# Patient Record
Sex: Male | Born: 1975 | Race: White | Hispanic: No | Marital: Married | Smoking: Never smoker
Health system: Southern US, Community
[De-identification: ages and names within clinical notes are randomized; demographics above are authoritative.]

## PROBLEM LIST (undated history)

## (undated) DIAGNOSIS — M958 Other specified acquired deformities of musculoskeletal system: Secondary | ICD-10-CM

## (undated) DIAGNOSIS — J45909 Unspecified asthma, uncomplicated: Secondary | ICD-10-CM

## (undated) HISTORY — PX: EYE SURGERY: SHX253

## (undated) HISTORY — PX: HERNIA REPAIR: SHX51

## (undated) HISTORY — PX: WISDOM TOOTH EXTRACTION: SHX21

---

## 2007-12-11 ENCOUNTER — Emergency Department (HOSPITAL_COMMUNITY): Admission: EM | Admit: 2007-12-11 | Discharge: 2007-12-11 | Payer: Self-pay | Admitting: Emergency Medicine

## 2011-09-21 ENCOUNTER — Ambulatory Visit (INDEPENDENT_AMBULATORY_CARE_PROVIDER_SITE_OTHER): Payer: 59 | Admitting: Family Medicine

## 2011-09-21 VITALS — BP 112/73 | HR 66 | Temp 98.7°F | Resp 20 | Ht 70.5 in | Wt 203.0 lb

## 2011-09-21 DIAGNOSIS — L2089 Other atopic dermatitis: Secondary | ICD-10-CM

## 2011-09-21 DIAGNOSIS — L255 Unspecified contact dermatitis due to plants, except food: Secondary | ICD-10-CM

## 2011-09-21 MED ORDER — PREDNISONE 20 MG PO TABS: 20.0000 mg | ORAL_TABLET | Freq: Every day | ORAL | Status: AC

## 2011-09-21 NOTE — Progress Notes (Signed)
  Subjective:    Patient ID: Bradley Russo, male    DOB: Feb 05, 1976, 36 y.o.   MRN: [id number]  HPI  Patient presents after developing poison ivy last Wednesday. Pruritic lesions on (R) arm and (R)hip. Burns, itches and weeping.  Review of Systems     Objective:   Physical Exam  Constitutional: He appears well-developed.  Cardiovascular: Normal rate, regular rhythm and normal heart sounds.   Pulmonary/Chest: Effort normal and breath sounds normal.  Skin:       Papules and vesicles (R) arm/hip        Assessment & Plan:  Rhus dermatitis  Prednisone 20 mg daily for 10 days OTC topical products recommended Reassurance

## 2016-01-20 ENCOUNTER — Emergency Department (HOSPITAL_COMMUNITY)
Admission: EM | Admit: 2016-01-20 | Discharge: 2016-01-20 | Disposition: A | Discharge status: Home / Self Care | Payer: Worker's Compensation | Attending: Emergency Medicine | Admitting: Emergency Medicine

## 2016-01-20 ENCOUNTER — Emergency Department (HOSPITAL_COMMUNITY): Payer: Worker's Compensation

## 2016-01-20 ENCOUNTER — Encounter (HOSPITAL_COMMUNITY): Payer: Self-pay

## 2016-01-20 DIAGNOSIS — S93401A Sprain of unspecified ligament of right ankle, initial encounter: Secondary | ICD-10-CM | POA: Insufficient documentation

## 2016-01-20 DIAGNOSIS — J45909 Unspecified asthma, uncomplicated: Secondary | ICD-10-CM | POA: Insufficient documentation

## 2016-01-20 DIAGNOSIS — Y929 Unspecified place or not applicable: Secondary | ICD-10-CM | POA: Diagnosis not present

## 2016-01-20 DIAGNOSIS — X509XXA Other and unspecified overexertion or strenuous movements or postures, initial encounter: Secondary | ICD-10-CM | POA: Insufficient documentation

## 2016-01-20 DIAGNOSIS — Y9301 Activity, walking, marching and hiking: Secondary | ICD-10-CM | POA: Diagnosis not present

## 2016-01-20 DIAGNOSIS — Y999 Unspecified external cause status: Secondary | ICD-10-CM | POA: Diagnosis not present

## 2016-01-20 DIAGNOSIS — S99911A Unspecified injury of right ankle, initial encounter: Secondary | ICD-10-CM | POA: Diagnosis present

## 2016-01-20 HISTORY — DX: Unspecified asthma, uncomplicated: J45.909

## 2016-01-20 MED ORDER — IBUPROFEN 400 MG PO TABS
800.0000 mg | ORAL_TABLET | Freq: Once | ORAL | Status: AC
  Administered 2016-01-20: 800 mg via ORAL
  Filled 2016-01-20: qty 2

## 2016-01-20 NOTE — ED Provider Notes (Signed)
Bradley Russo Provider Note   CSN: [id number] Arrival date & time: 01/20/16  1036  By signing my name below, I, [name], attest that this documentation has been prepared under the direction and in the presence of [facility], PA-C. Electronically signed by: [name], ED Scribe. 01/20/16. 11:00 AM.  History   Chief Complaint Chief Complaint  Patient presents with  . Ankle Pain   The history is provided by the patient and medical records. No language interpreter was used.   HPI Comments:  Bradley Russo is a 40 y.o. male who presents to the Emergency Department complaining of right ankle pain status post fall, which occurred an hour ago. Pt reports that he rolled his ankle in a hole when turning backwards and walking and states that he heard a "popping" sound. Pain localized to front and lateral ankle and is characterized as a throbbing sensation. He rates pain a 4/10. Associated symptoms include swelling in ankle, decreased ROM secondary to swelling and pain, and initial numbness, which has improved with elevation. Pt has not taken pain medication prior to arrival. Denies numbness now, tingling or pain in other areas. He endorses rolling his ankle this ankle a few times in the past. No other hx of injuries to RLE.    Past Medical History:  Diagnosis Date  . Asthma     There are no active problems to display for this patient.   Past Surgical History:  Procedure Laterality Date  . HERNIA REPAIR         Home Medications    Prior to Admission medications   Medication Sig Start Date End Date Taking? Authorizing Provider  Fluticasone-Salmeterol (ADVAIR) 100-50 MCG/DOSE AEPB Inhale 1 puff into the lungs every 12 (twelve) hours.    Historical Provider, MD  loratadine (CLARITIN) 10 MG tablet Take 10 mg by mouth daily.    Historical Provider, MD    Family History History reviewed. No pertinent family history.  Social History Social History  Substance Use Topics  .  Smoking status: Never Smoker  . Smokeless tobacco: Former Systems developer  . Alcohol use Yes     Comment: occassional     Allergies   Penicillins   Review of Systems Review of Systems  Musculoskeletal: Positive for arthralgias, joint swelling and myalgias.  Neurological: Negative for weakness and numbness.     Physical Exam Updated Vital Signs BP 122/76 (BP Location: Left Arm)   Pulse (!) 56   Temp 98.5 F (36.9 C) (Oral)   Resp 18   Ht 5' 11"  (1.803 m)   Wt 88.5 kg   SpO2 100%   BMI 27.20 kg/m   Physical Exam  Constitutional: He is oriented to person, place, and time. He appears well-developed and well-nourished. No distress.  HENT:  Head: Normocephalic and atraumatic.  Cardiovascular: Normal rate, regular rhythm, normal heart sounds and intact distal pulses.   Pulmonary/Chest: Effort normal and breath sounds normal. No respiratory distress.  Musculoskeletal:  Right ankle with decreased ROM 2/2 pain. + Swelling and tenderness around the lateral malleolus. No erythema, ecchymosis, or deformity appreciated.No TTP or swelling of fore foot or calf. No break in skin. No warmth. Achilles intact. Good pedal pulse and cap refill of all toes. Wiggling toes without difficulty. Sensation grossly intact.  Neurological: He is alert and oriented to person, place, and time.  Skin: Skin is warm and dry. Capillary refill takes less than 2 seconds. No erythema.  Nursing note and vitals reviewed.  ED Treatments / Results  DIAGNOSTIC STUDIES:  Oxygen Saturation is 98% on room air, normal by my interpretation.    COORDINATION OF CARE:  10:58 AM Discussed treatment plan with pt at bedside, which includes x-ray of right ankle and Ibuprofen, and pt agreed to plan.  Labs (all labs ordered are listed, but only abnormal results are displayed) Labs Reviewed - No data to display  EKG  EKG Interpretation None       Radiology Dg Ankle Complete Right  Result Date: 01/20/2016 CLINICAL  DATA:  Lateral ankle pain and swelling following twisting injury. EXAM: RIGHT ANKLE - COMPLETE 3+ VIEW COMPARISON:  None. FINDINGS: The mineralization and alignment are normal. There is no evidence of acute fracture or dislocation. The joint spaces are maintained. There is moderate lateral soft tissue swelling. There is a plantar calcaneal spur. IMPRESSION: Moderate lateral soft tissue swelling. No acute osseous findings demonstrated. Electronically Signed   By: Richardean Sale M.D.   On: 01/20/2016 11:16    Procedures Procedures (including critical care time)  Medications Ordered in ED Medications  ibuprofen (ADVIL,MOTRIN) tablet 800 mg (800 mg Oral Given 01/20/16 1127)     Initial Impression / Assessment and Plan / ED Course  I have reviewed the triage vital signs and the nursing notes.  Pertinent labs & imaging results that were available during my care of the patient were reviewed by me and considered in my medical decision making (see chart for details).  Clinical Course   ALFERD Russo is a 40 y.o. male who presents to ED for right ankle pain after rolling ankle this morning. On exam, RLE is NVI. He does exhibit significant swelling and tenderness to palpation over lateral malleolus. X-ray was obtained which show moderate soft tissue swelling but no bony abnormalities. ASO brace provided in ED. Crutches offered, but patient declines. RICE and symptomatic home care instructions discussed. PCP or return follow-up if symptoms do not improve. Reasons to return to ED discussed and all questions answered.  Final Clinical Impressions(s) / ED Diagnoses   Final diagnoses:  Ankle sprain, right, initial encounter    New Prescriptions Discharge Medication List as of 01/20/2016 11:34 AM     I personally performed the services described in this documentation, which was scribed in my presence. The recorded information has been reviewed and is accurate.    [facility] Kemal Amores, PA-C 01/20/16  1612    Nat Christen, MD 01/21/16 [phone]

## 2016-01-20 NOTE — ED Triage Notes (Signed)
Pt. Was backing up and rolled his ankle in a hole.  Rt. Lateral ankle is swollen. +pedal pulse. +CNS  Ice pack applied with elevation in Fast Track

## 2016-01-20 NOTE — Discharge Instructions (Signed)
Ibuprofen three times daily as needed for pain. Ice and elevate for additional pain relief. Use ankle brace whenever ambulating. Follow-up with your primary care provider if no improvement in symptoms after 1 week.   COLD THERAPY DIRECTIONS:  Ice or gel packs can be used to reduce both pain and swelling. Ice is the most helpful within the first 24 to 48 hours after an injury or flareup from overusing a muscle or joint.  Ice is effective, has very few side effects, and is safe for most people to use.   If you expose your skin to cold temperatures for too long or without the proper protection, you can damage your skin or nerves. Watch for signs of skin damage due to cold.   HOME CARE INSTRUCTIONS  Follow these tips to use ice and cold packs safely.  Place a dry or damp towel between the ice and skin. A damp towel will cool the skin more quickly, so you may need to shorten the time that the ice is used.  For a more rapid response, add gentle compression to the ice.  Ice for no more than 10 to 20 minutes at a time. The bonier the area you are icing, the less time it will take to get the benefits of ice.  Check your skin after 5 minutes to make sure there are no signs of a poor response to cold or skin damage.  Rest 20 minutes or more in between uses.  Once your skin is numb, you can end your treatment. You can test numbness by very lightly touching your skin. The touch should be so light that you do not see the skin dimple from the pressure of your fingertip. When using ice, most people will feel these normal sensations in this order: cold, burning, aching, and numbness.

## 2016-11-27 ENCOUNTER — Other Ambulatory Visit: Payer: Self-pay | Admitting: Orthopedic Surgery

## 2016-12-07 ENCOUNTER — Encounter (HOSPITAL_BASED_OUTPATIENT_CLINIC_OR_DEPARTMENT_OTHER): Payer: Self-pay | Admitting: *Deleted

## 2016-12-10 ENCOUNTER — Encounter (HOSPITAL_BASED_OUTPATIENT_CLINIC_OR_DEPARTMENT_OTHER)
Admission: RE | Disposition: A | Discharge status: Home / Self Care | Payer: Self-pay | Source: Ambulatory Visit | Attending: Orthopedic Surgery

## 2016-12-10 ENCOUNTER — Ambulatory Visit (HOSPITAL_BASED_OUTPATIENT_CLINIC_OR_DEPARTMENT_OTHER): Payer: Worker's Compensation | Admitting: Anesthesiology

## 2016-12-10 ENCOUNTER — Ambulatory Visit (HOSPITAL_BASED_OUTPATIENT_CLINIC_OR_DEPARTMENT_OTHER)
Admission: RE | Admit: 2016-12-10 | Discharge: 2016-12-10 | Disposition: A | Discharge status: Home / Self Care | Payer: Worker's Compensation | Source: Ambulatory Visit | Attending: Orthopedic Surgery | Admitting: Orthopedic Surgery

## 2016-12-10 ENCOUNTER — Encounter (HOSPITAL_BASED_OUTPATIENT_CLINIC_OR_DEPARTMENT_OTHER): Payer: Self-pay

## 2016-12-10 DIAGNOSIS — Z87828 Personal history of other (healed) physical injury and trauma: Secondary | ICD-10-CM | POA: Insufficient documentation

## 2016-12-10 DIAGNOSIS — M898X7 Other specified disorders of bone, ankle and foot: Secondary | ICD-10-CM | POA: Insufficient documentation

## 2016-12-10 DIAGNOSIS — Z880 Allergy status to penicillin: Secondary | ICD-10-CM | POA: Insufficient documentation

## 2016-12-10 DIAGNOSIS — J45909 Unspecified asthma, uncomplicated: Secondary | ICD-10-CM | POA: Diagnosis not present

## 2016-12-10 DIAGNOSIS — M65871 Other synovitis and tenosynovitis, right ankle and foot: Secondary | ICD-10-CM | POA: Diagnosis not present

## 2016-12-10 DIAGNOSIS — Z87891 Personal history of nicotine dependence: Secondary | ICD-10-CM | POA: Diagnosis not present

## 2016-12-10 HISTORY — PX: ANKLE ARTHROSCOPY: SHX545

## 2016-12-10 HISTORY — DX: Other specified acquired deformities of musculoskeletal system: M95.8

## 2016-12-10 SURGERY — ARTHROSCOPY, ANKLE
Anesthesia: General | Site: Ankle | Laterality: Right

## 2016-12-10 MED ORDER — DEXAMETHASONE SODIUM PHOSPHATE 4 MG/ML IJ SOLN
INTRAMUSCULAR | Status: DC | PRN
  Administered 2016-12-10: 10 mg via INTRAVENOUS

## 2016-12-10 MED ORDER — CHLORHEXIDINE GLUCONATE 4 % EX LIQD: 60.0000 mL | Freq: Once | CUTANEOUS | Status: DC

## 2016-12-10 MED ORDER — LIDOCAINE HCL (CARDIAC) 20 MG/ML IV SOLN
INTRAVENOUS | Status: DC | PRN
  Administered 2016-12-10: 40 mg via INTRAVENOUS

## 2016-12-10 MED ORDER — LACTATED RINGERS IV SOLN
INTRAVENOUS | Status: DC
  Administered 2016-12-10: 12:00:00 via INTRAVENOUS

## 2016-12-10 MED ORDER — PROMETHAZINE HCL 25 MG/ML IJ SOLN: 6.2500 mg | INTRAMUSCULAR | Status: DC | PRN

## 2016-12-10 MED ORDER — HYDROMORPHONE HCL 1 MG/ML IJ SOLN: 0.2500 mg | INTRAMUSCULAR | Status: DC | PRN

## 2016-12-10 MED ORDER — CEFAZOLIN SODIUM-DEXTROSE 2-4 GM/100ML-% IV SOLN
2.0000 g | INTRAVENOUS | Status: AC
  Administered 2016-12-10: 2 g via INTRAVENOUS

## 2016-12-10 MED ORDER — SCOPOLAMINE 1 MG/3DAYS TD PT72: 1.0000 | MEDICATED_PATCH | Freq: Once | TRANSDERMAL | Status: DC | PRN

## 2016-12-10 MED ORDER — FENTANYL CITRATE (PF) 100 MCG/2ML IJ SOLN
INTRAMUSCULAR | Status: AC
  Filled 2016-12-10: qty 2

## 2016-12-10 MED ORDER — ONDANSETRON HCL 4 MG/2ML IJ SOLN
INTRAMUSCULAR | Status: AC
  Filled 2016-12-10: qty 2

## 2016-12-10 MED ORDER — MIDAZOLAM HCL 2 MG/2ML IJ SOLN
INTRAMUSCULAR | Status: AC
  Filled 2016-12-10: qty 2

## 2016-12-10 MED ORDER — MIDAZOLAM HCL 2 MG/2ML IJ SOLN
1.0000 mg | INTRAMUSCULAR | Status: DC | PRN
  Administered 2016-12-10: 2 mg via INTRAVENOUS

## 2016-12-10 MED ORDER — SODIUM CHLORIDE 0.9 % IR SOLN
Status: DC | PRN
  Administered 2016-12-10: 2000 mL

## 2016-12-10 MED ORDER — LIDOCAINE 2% (20 MG/ML) 5 ML SYRINGE
INTRAMUSCULAR | Status: AC
Start: 2016-12-10 — End: 2016-12-10
  Filled 2016-12-10: qty 5

## 2016-12-10 MED ORDER — DEXAMETHASONE SODIUM PHOSPHATE 10 MG/ML IJ SOLN
INTRAMUSCULAR | Status: AC
  Filled 2016-12-10: qty 1

## 2016-12-10 MED ORDER — SODIUM CHLORIDE 0.9 % IV SOLN: INTRAVENOUS | Status: DC

## 2016-12-10 MED ORDER — BUPIVACAINE-EPINEPHRINE (PF) 0.5% -1:200000 IJ SOLN
INTRAMUSCULAR | Status: DC | PRN
  Administered 2016-12-10: 40 mL via PERINEURAL

## 2016-12-10 MED ORDER — OXYCODONE HCL 5 MG PO TABS: 5.0000 mg | ORAL_TABLET | ORAL | 0 refills | Status: AC | PRN

## 2016-12-10 MED ORDER — FENTANYL CITRATE (PF) 100 MCG/2ML IJ SOLN
50.0000 ug | INTRAMUSCULAR | Status: DC | PRN
  Administered 2016-12-10: 100 ug via INTRAVENOUS

## 2016-12-10 MED ORDER — PROPOFOL 10 MG/ML IV BOLUS
INTRAVENOUS | Status: DC | PRN
  Administered 2016-12-10: 200 mg via INTRAVENOUS

## 2016-12-10 MED ORDER — ASPIRIN EC 81 MG PO TBEC: 81.0000 mg | DELAYED_RELEASE_TABLET | Freq: Two times a day (BID) | ORAL | 0 refills | Status: AC

## 2016-12-10 MED ORDER — CEFAZOLIN SODIUM-DEXTROSE 2-4 GM/100ML-% IV SOLN
INTRAVENOUS | Status: AC
  Filled 2016-12-10: qty 100

## 2016-12-10 MED ORDER — ARTIFICIAL TEARS OPHTHALMIC OINT
TOPICAL_OINTMENT | OPHTHALMIC | Status: AC
  Filled 2016-12-10: qty 3.5

## 2016-12-10 SURGICAL SUPPLY — 80 items
BANDAGE ESMARK 6X9 LF (GAUZE/BANDAGES/DRESSINGS) IMPLANT
BLADE CUDA 2.0 (BLADE) IMPLANT
BLADE CUDA GRT WHITE 3.5 (BLADE) IMPLANT
BLADE CUDA SHAVER 3.5 (BLADE) IMPLANT
BLADE CUTTER GATOR 3.5 (BLADE) IMPLANT
BLADE SURG 15 STRL LF DISP TIS (BLADE) ×1 IMPLANT
BLADE SURG 15 STRL SS (BLADE) ×3
BNDG CMPR 9X6 STRL LF SNTH (GAUZE/BANDAGES/DRESSINGS) ×1
BNDG COHESIVE 4X5 TAN STRL (GAUZE/BANDAGES/DRESSINGS) ×2 IMPLANT
BNDG COHESIVE 6X5 TAN STRL LF (GAUZE/BANDAGES/DRESSINGS) ×2 IMPLANT
BNDG ESMARK 6X9 LF (GAUZE/BANDAGES/DRESSINGS) ×3
BOOT STEPPER DURA LG (SOFTGOODS) IMPLANT
BOOT STEPPER DURA MED (SOFTGOODS) IMPLANT
BOOT STEPPER DURA SM (SOFTGOODS) IMPLANT
BUR 3.5 LG SPHERICAL (BURR) IMPLANT
BUR CUDA 2.9 (BURR) ×1 IMPLANT
BUR CUDA 2.9MM (BURR) ×1
BUR FULL RADIUS 2.9 (BURR) IMPLANT
BUR FULL RADIUS 2.9MM (BURR)
BUR GATOR 2.9 (BURR) IMPLANT
BUR GATOR 2.9MM (BURR)
BUR OVAL 4.0 (BURR) IMPLANT
BUR SPHERICAL 2.9 (BURR) IMPLANT
BUR SPHERICAL 2.9MM (BURR)
BUR VERTEX HOODED 4.5 (BURR) IMPLANT
BURR 3.5 LG SPHERICAL (BURR)
BURR 3.5MM LG SPHERICAL (BURR)
CHLORAPREP W/TINT 26ML (MISCELLANEOUS) ×3 IMPLANT
CUFF TOURNIQUET SINGLE 34IN LL (TOURNIQUET CUFF) ×2 IMPLANT
DRAPE EXTREMITY T 121X128X90 (DRAPE) ×3 IMPLANT
DRAPE OEC MINIVIEW 54X84 (DRAPES) ×2 IMPLANT
DRAPE U-SHAPE 47X51 STRL (DRAPES) ×3 IMPLANT
DRSG MEPITEL 4X7.2 (GAUZE/BANDAGES/DRESSINGS) ×3 IMPLANT
DRSG PAD ABDOMINAL 8X10 ST (GAUZE/BANDAGES/DRESSINGS) IMPLANT
ELECT REM PT RETURN 9FT ADLT (ELECTROSURGICAL) ×3
ELECTRODE REM PT RTRN 9FT ADLT (ELECTROSURGICAL) ×1 IMPLANT
GAUZE SPONGE 4X4 12PLY STRL (GAUZE/BANDAGES/DRESSINGS) ×3 IMPLANT
GLOVE BIO SURGEON STRL SZ8 (GLOVE) ×3 IMPLANT
GLOVE BIOGEL PI IND STRL 7.0 (GLOVE) IMPLANT
GLOVE BIOGEL PI IND STRL 8 (GLOVE) ×2 IMPLANT
GLOVE BIOGEL PI INDICATOR 7.0 (GLOVE) ×4
GLOVE BIOGEL PI INDICATOR 8 (GLOVE) ×2
GLOVE ECLIPSE 6.5 STRL STRAW (GLOVE) ×2 IMPLANT
GLOVE ECLIPSE 8.0 STRL XLNG CF (GLOVE) ×3 IMPLANT
GOWN STRL REUS W/ TWL LRG LVL3 (GOWN DISPOSABLE) ×1 IMPLANT
GOWN STRL REUS W/ TWL XL LVL3 (GOWN DISPOSABLE) ×2 IMPLANT
GOWN STRL REUS W/TWL LRG LVL3 (GOWN DISPOSABLE) ×3
GOWN STRL REUS W/TWL XL LVL3 (GOWN DISPOSABLE) ×3
IMPLANT SCP KIT FOOT ANKLE 3 (Orthopedic Implant) ×3 IMPLANT
KIT ACCUFILL 3CC (Orthopedic Implant) IMPLANT
MANIFOLD NEPTUNE II (INSTRUMENTS) ×3 IMPLANT
PACK ARTHROSCOPY DSU (CUSTOM PROCEDURE TRAY) ×3 IMPLANT
PACK BASIN DAY SURGERY FS (CUSTOM PROCEDURE TRAY) ×3 IMPLANT
PAD CAST 4YDX4 CTTN HI CHSV (CAST SUPPLIES) ×1 IMPLANT
PADDING CAST ABS 4INX4YD NS (CAST SUPPLIES)
PADDING CAST ABS COTTON 4X4 ST (CAST SUPPLIES) IMPLANT
PADDING CAST COTTON 4X4 STRL (CAST SUPPLIES) ×3
PADDING CAST COTTON 6X4 STRL (CAST SUPPLIES) ×2 IMPLANT
PENCIL BUTTON HOLSTER BLD 10FT (ELECTRODE) ×2 IMPLANT
SANITIZER HAND PURELL 535ML FO (MISCELLANEOUS) ×3 IMPLANT
SET ARTHROSCOPY TUBING (MISCELLANEOUS) ×3
SET ARTHROSCOPY TUBING LN (MISCELLANEOUS) ×1 IMPLANT
SET IRRIG Y TYPE TUR BLADDER L (SET/KITS/TRAYS/PACK) IMPLANT
SLEEVE SCD COMPRESS KNEE MED (MISCELLANEOUS) ×3 IMPLANT
SPLINT FAST PLASTER 5X30 (CAST SUPPLIES) ×40
SPLINT PLASTER CAST FAST 5X30 (CAST SUPPLIES) IMPLANT
SPONGE LAP 18X18 X RAY DECT (DISPOSABLE) ×2 IMPLANT
STOCKINETTE 6  STRL (DRAPES) ×2
STOCKINETTE 6 STRL (DRAPES) ×1 IMPLANT
STRAP ANKLE FOOT DISTRACTOR (ORTHOPEDIC SUPPLIES) ×3 IMPLANT
SUT ETHILON 3 0 PS 1 (SUTURE) ×3 IMPLANT
SUT MNCRL AB 3-0 PS2 18 (SUTURE) IMPLANT
SUT VIC AB 0 SH 27 (SUTURE) IMPLANT
SUT VIC AB 2-0 SH 27 (SUTURE)
SUT VIC AB 2-0 SH 27XBRD (SUTURE) IMPLANT
SYR BULB 3OZ (MISCELLANEOUS) IMPLANT
TOWEL OR 17X24 6PK STRL BLUE (TOWEL DISPOSABLE) ×3 IMPLANT
TUBE CONNECTING 20'X1/4 (TUBING)
TUBE CONNECTING 20X1/4 (TUBING) IMPLANT
WATER STERILE IRR 1000ML POUR (IV SOLUTION) ×3 IMPLANT

## 2016-12-10 NOTE — Anesthesia Procedure Notes (Addendum)
Anesthesia Regional Block: Popliteal block   Pre-Anesthetic Checklist: ,, timeout performed, Correct Patient, Correct Site, Correct Laterality, Correct Procedure, Correct Position, site marked, Risks and benefits discussed,  Surgical consent,  Pre-op evaluation,  At surgeon's request and post-op pain management  Laterality: Right  Prep: chloraprep       Needles:  Injection technique: Single-shot  Needle Type: Echogenic Stimulator Needle          Additional Needles:   Procedures: ultrasound guided,,,,,,,,  Narrative:  Start time: 12/10/2016 12:24 PM End time: 12/10/2016 12:34 PM Injection made incrementally with aspirations every 5 mL.  Performed by: Personally  Anesthesiologist: Duane Boston  Additional Notes: A functioning IV was confirmed and monitors were applied.  Sterile prep and drape, hand hygiene and sterile gloves were used.  Negative aspiration and test dose prior to incremental administration of local anesthetic. The patient tolerated the procedure well.Ultrasound  guidance: relevant anatomy identified, needle position confirmed, local anesthetic spread visualized around nerve(s), vascular puncture avoided.  Image printed for medical record. ACB supplement.

## 2016-12-10 NOTE — Op Note (Signed)
12/10/2016  2:52 PM  PATIENT:  Bradley Russo  41 y.o. male  PRE-OPERATIVE DIAGNOSIS: 1.  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  Possible right ankle syndesmosis inury  POST-OPERATIVE DIAGNOSIS: 1.  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  Stable right ankle syndesmosis 3.  Right ankle medial and lateral synovitis  Procedure(s): 1.  Right Ankle Arthroscopy with Extensive Debridement 2.  Arthroscopic Treatment of Osteochondral Lesion of the talus with subchondroplasty 3.  AP and lateral xrays of the right ankle  SURGEON:  Wylene Simmer, MD  ASSISTANT: none  ANESTHESIA:   General, regional  EBL:  minimal   TOURNIQUET:  69 min at 481 mm Hg  COMPLICATIONS:  None apparent  DISPOSITION:  Extubated, awake and stable to recovery.  PROCEDURE IN DETAIL:  After pre operative consent was obtained, and the correct operative site was identified, the patient was brought to the operating room and placed supine on the OR table.  Anesthesia was administered.  Pre-operative antibiotics were administered.  A surgical timeout was taken.  The right lower extremity was then prepped and draped in standard sterile fashion with the thigh held in a leg [place]. A traction strap was then applied to the right foot. Gentle longitudinal traction was applied to the ankle. The ankle was exsanguinated and tourniquet inflated to 250 mmHg. An anteromedial arthroscopy portal was then established using the nick and spread technique. The arthroscope was inserted into the ankle joint. Immediately evident was significant anterior synovitis.  The arthroscope was then used to create an anterolateral arthroscopy portal under direct vision. A probe was inserted into the joint. The syndesmosis was tested with anterior posterior displacement. There was no instability noted. A shaver was inserted. The anterolateral synovitis was resected. The talar dome was noted to have healthy intact  cartilage over the central and lateral portions. The tibial plafond was noted to have healthy intact cartilage. Posterolateral and posteromedial joint lines were generally healthy with no evidence of loose body or synovitis. The medial talar dome posterior lip was healthy. Anteriorly there was an osteochondral lesion with displaced articular cartilage fragments. The arthroscope was then switched to the lateral portal. A shaver was inserted medially and synovitis was resected from the medial gutter and anterior gutter. The loose fragments of cartilage were removed with a grasper. A curet was used to remove all loose cartilage to a stable rim. All loose fragments were removed.  A stab incision was made sinus Tarsi. The Biomet subchondral plastic cannula was drilled into the cystic lesion under fluoroscopic guidance in AP and lateral planes. 3 mL of bone graft substitute were infiltrated into the talar dome lesion through the cannula. Adequate hardening time was allowed. The arthroscope was reinserted into the ankle joint. There was subchondral plastic material within the joint. This was removed with a shaver. The roof of the cystic lesion was noted to be disrupted. Bone fragments were removed with the shaver.  AP and lateral ray grafts confirmed appropriate filling of the defect. Bone graft substitute was seen at the superficial aspect of the cystic lesion showing appropriate filling of the defect. The wounds were closed with nylon sterile dressings were applied followed by a well-padded short leg splint tourniquet was released after application of the dressings at 69 minutes patient was awakened from anesthesia and transported to the recovery room in stable condition.  FOLLOW UP PLAN: Nonweightbearing on the right lower extremity. Follow-up with me in 2 weeks for suture removal  and to begin early range of motion and weightbearing. Aspirin 81 mg by mouth twice a day for DVT prophylaxis.  RADIOGRAPHS:  AP and  lateral radiographs of the right ankle were obtained intraoperatively. These show appropriate position of the cannula and appropriate filling of the osteochondral lesion.

## 2016-12-10 NOTE — Anesthesia Procedure Notes (Signed)
Procedure Name: LMA Insertion Performed by: Terrance Mass Pre-anesthesia Checklist: Patient identified, Emergency Drugs available, Suction available and Patient being monitored Patient Re-evaluated:Patient Re-evaluated prior to induction Oxygen Delivery Method: Circle system utilized Preoxygenation: Pre-oxygenation with 100% oxygen Induction Type: IV induction Ventilation: Mask ventilation without difficulty LMA: LMA inserted LMA Size: 4.0 Number of attempts: 1 Placement Confirmation: positive ETCO2 Tube secured with: Tape Dental Injury: Teeth and Oropharynx as per pre-operative assessment

## 2016-12-10 NOTE — Transfer of Care (Signed)
Immediate Anesthesia Transfer of Care Note  Patient: Bradley Russo  Procedure(s) Performed: Procedure(s): Right Ankle Arthroscopy with Extensive Debridement and Treatment of Osteochondral Lesion (Right)  Patient Location: PACU  Anesthesia Type:General  Level of Consciousness: awake and sedated  Airway & Oxygen Therapy: Patient Spontanous Breathing and Patient connected to face mask oxygen  Post-op Assessment: Report given to RN and Post -op Vital signs reviewed and stable  Post vital signs: Reviewed and stable  Last Vitals:  Vitals:   12/10/16 1240 12/10/16 1444  BP:    Pulse: 60 69  Resp: 14   Temp:      Last Pain:  Vitals:   12/10/16 1120  TempSrc: Oral  PainSc: 2       Patients Stated Pain Goal: 1 (37/04/88 8916)  Complications: No apparent anesthesia complications

## 2016-12-10 NOTE — H&P (Signed)
Bradley Russo is an 41 y.o. male.   Chief Complaint:  Right ankle pain HPI:  41 y/o male with h/o right ankle injury at work many months ago.  MRI shows an osteochondral defect of the medial talar dome.  He has failed non op treatment and presents today for ankle arthroscopy and treatment of the OLT with possible syndesmosis fixation.  Past Medical History:  Diagnosis Date  . Asthma   . Osteochondral defect of ankle    right    Past Surgical History:  Procedure Laterality Date  . EYE SURGERY     lasik  . HERNIA REPAIR     at 41 yrs old  . WISDOM TOOTH EXTRACTION      History reviewed. No pertinent family history. Social History:  reports that he has never smoked. He has quit using smokeless tobacco. He reports that he drinks alcohol. He reports that he does not use drugs.  Allergies:  Allergies  Allergen Reactions  . Penicillins     Unknown-childhood allergy    Medications Prior to Admission  Medication Sig Dispense Refill  . Fluticasone-Salmeterol (ADVAIR) 100-50 MCG/DOSE AEPB Inhale 1 puff into the lungs every 12 (twelve) hours.    Marland Kitchen loratadine (CLARITIN) 10 MG tablet Take 10 mg by mouth daily.    . valACYclovir (VALTREX) 1000 MG tablet Take 1,000 mg by mouth 2 (two) times daily.      No results found for this or any previous visit (from the past 48 hour(s)). No results found.  ROS  No recent f/c/n/v/wt loss  Blood pressure 135/87, pulse 72, temperature 98.7 F (37.1 C), temperature source Oral, resp. rate 16, height 5' 11"  (1.803 m), weight 90.3 kg (199 lb), SpO2 98 %. Physical Exam  wn wd male in nad.  A and O x 4.  Mood and affect normal.  EOMI.  resp unlabored.  R ankle without swelling or effusion.  Skin healthy andin tact.  No lymphadenopathy.  5/5 strength in PF and DF of the ankle and toes.  Sens to LT intact at the foot dorsally and plantarly.    Assessment/Plan R talus osteochondral defect.  To OR for surgical treatment.  The risks and benefits of the  alternative treatment options have been discussed in detail.  The patient wishes to proceed with surgery and specifically understands risks of bleeding, infection, nerve damage, blood clots, need for additional surgery, amputation and death.   Wylene Simmer, MD 2016-12-22, 12:27 PM

## 2016-12-10 NOTE — Discharge Instructions (Signed)
Bradley Simmer, MD [place]  Please read the following information regarding your care after surgery.  Medications  You only need a prescription for the narcotic pain medicine (ex. oxycodone, Percocet, Norco).  All of the other medicines listed below are available over the counter. X acetominophen (Tylenol) 650 mg every 4-6 hours as you need for minor pain X oxycodone as prescribed for moderate to severe pain X Aleve 2 pills twice a day for the first 3 days after surgery.  Narcotic pain medicine (ex. oxycodone, Percocet, Vicodin) will cause constipation.  To prevent this problem, take the following medicines while you are taking any pain medicine. X docusate sodium (Colace) 100 mg twice a day X senna (Senokot) 2 tablets twice a day  X To help prevent blood clots, take a baby aspirin (81 mg) twice a day for two weeks after surgery.  You should also get up every hour while you are awake to move around.    Weight Bearing ? Bear weight when you are able on your operated leg or foot. ? Bear weight only on the heel of your operated foot in the post-op shoe. X Do not bear any weight on the operated leg or foot.  Cast / Splint / Dressing X Keep your splint, cast or dressing clean and dry.  Dont put anything (coat hanger, pencil, etc) down inside of it.  If it gets damp, use a hair dryer on the cool setting to dry it.  If it gets soaked, call the office to schedule an appointment for a cast change. ? Remove your dressing 3 days after surgery and cover the incisions with dry dressings.    After your dressing, cast or splint is removed; you may shower, but do not soak or scrub the wound.  Allow the water to run over it, and then gently pat it dry.  Swelling It is normal for you to have swelling where you had surgery.  To reduce swelling and pain, keep your toes above your nose for at least 3 days after surgery.  It may be necessary to keep your foot or leg elevated for several weeks.   If it hurts, it should be elevated.  Follow Up Call my office at [phone] when you are discharged from the hospital or surgery center to schedule an appointment to be seen two weeks after surgery.  Call my office at [phone] if you develop a fever >101.5 F, nausea, vomiting, bleeding from the surgical site or severe pain.       Regional Anesthesia Blocks  1. Numbness or the inability to move the "blocked" extremity may last from 3-48 hours after placement. The length of time depends on the medication injected and your individual response to the medication. If the numbness is not going away after 48 hours, call your surgeon.  2. The extremity that is blocked will need to be protected until the numbness is gone and the  Strength has returned. Because you cannot feel it, you will need to take extra care to avoid injury. Because it may be weak, you may have difficulty moving it or using it. You may not know what position it is in without looking at it while the block is in effect.  3. For blocks in the legs and feet, returning to weight bearing and walking needs to be done carefully. You will need to wait until the numbness is entirely gone and the strength has returned. You should be able to move your leg and  foot normally before you try and bear weight or walk. You will need someone to be with you when you first try to ensure you do not fall and possibly risk injury.  4. Bruising and tenderness at the needle site are common side effects and will resolve in a few days.  5. Persistent numbness or new problems with movement should be communicated to the surgeon or the [place] [phone] [place] [phone]).   Post Anesthesia Home Care Instructions  Activity: Get plenty of rest for the remainder of the day. A responsible individual must stay with you for 24 hours following the procedure.  For the next 24 hours, DO NOT: -Drive a car -Conservation officer, nature -Drink alcoholic beverages -Take any medication unless instructed by your physician -Make any legal decisions or sign important papers.  Meals: Start with liquid foods such as gelatin or soup. Progress to regular foods as tolerated. Avoid greasy, spicy, heavy foods. If nausea and/or vomiting occur, drink only clear liquids until the nausea and/or vomiting subsides. Call your physician if vomiting continues.  Special Instructions/Symptoms: Your throat may feel dry or sore from the anesthesia or the breathing tube placed in your throat during surgery. If this causes discomfort, gargle with warm salt water. The discomfort should disappear within 24 hours.  If you had a scopolamine patch placed behind your ear for the management of post- operative nausea and/or vomiting:  1. The medication in the patch is effective for 72 hours, after which it should be removed.  Wrap patch in a tissue and discard in the trash. Wash hands thoroughly with soap and water. 2. You may remove the patch earlier than 72 hours if you experience unpleasant side effects which may include dry mouth, dizziness or visual disturbances. 3. Avoid touching the patch. Wash your hands with soap and water after contact with the patch.   Call your surgeon if you experience:   1.  Fever over 101.0. 2.  Inability to urinate. 3.  Nausea and/or vomiting. 4.  Extreme swelling or bruising at the surgical site. 5.  Continued bleeding from the incision. 6.  Increased pain, redness or drainage from the incision. 7.  Problems related to your pain medication. 8.  Any problems and/or concerns

## 2016-12-10 NOTE — Progress Notes (Signed)
Assisted Dr. Singer with right, ultrasound guided, popliteal/saphenous block. Side rails up, monitors on throughout procedure. See vital signs in flow sheet. Tolerated Procedure well. 

## 2016-12-10 NOTE — Anesthesia Postprocedure Evaluation (Signed)
Anesthesia Post Note  Patient: NASON CONRADT  Procedure(s) Performed: Procedure(s) (LRB): Right Ankle Arthroscopy with Extensive Debridement and Treatment of Osteochondral Lesion (Subchondroplasty) (Right)     Patient location during evaluation: PACU Anesthesia Type: General Level of consciousness: sedated Pain management: pain level controlled Vital Signs Assessment: post-procedure vital signs reviewed and stable Respiratory status: spontaneous breathing and respiratory function stable Cardiovascular status: stable Anesthetic complications: no    Last Vitals:  Vitals:   12/10/16 1519 12/10/16 1530  BP:  122/80  Pulse: 78 75  Resp: 18 16  Temp:  36.5 C    Last Pain:  Vitals:   12/10/16 1530  TempSrc:   PainSc: 0-No pain                 Lunden Mcleish DANIEL

## 2016-12-10 NOTE — Anesthesia Preprocedure Evaluation (Signed)
Anesthesia Evaluation  Patient identified by MRN, date of birth, ID band Patient awake    Reviewed: Allergy & Precautions, NPO status , Patient's Chart, lab work & pertinent test results  History of Anesthesia Complications Negative for: history of anesthetic complications  Airway Mallampati: II  TM Distance: >3 FB Neck ROM: Full    Dental no notable dental hx. (+) Dental Advisory Given   Pulmonary asthma ,    Pulmonary exam normal        Cardiovascular negative cardio ROS Normal cardiovascular exam     Neuro/Psych negative neurological ROS  negative psych ROS   GI/Hepatic negative GI ROS, Neg liver ROS,   Endo/Other  negative endocrine ROS  Renal/GU negative Renal ROS  negative genitourinary   Musculoskeletal negative musculoskeletal ROS (+)   Abdominal   Peds negative pediatric ROS (+)  Hematology negative hematology ROS (+)   Anesthesia Other Findings   Reproductive/Obstetrics negative OB ROS                             Anesthesia Physical Anesthesia Plan  ASA: II  Anesthesia Plan: General   Post-op Pain Management: GA combined w/ Regional for post-op pain   Induction: Intravenous  PONV Risk Score and Plan: 2 and Ondansetron and Dexamethasone  Airway Management Planned: LMA  Additional Equipment:   Intra-op Plan:   Post-operative Plan: Extubation in OR  Informed Consent: I have reviewed the patients History and Physical, chart, labs and discussed the procedure including the risks, benefits and alternatives for the proposed anesthesia with the patient or authorized representative who has indicated his/her understanding and acceptance.   Dental advisory given  Plan Discussed with: CRNA, Anesthesiologist and Surgeon  Anesthesia Plan Comments:         Anesthesia Quick Evaluation

## 2016-12-11 ENCOUNTER — Encounter (HOSPITAL_BASED_OUTPATIENT_CLINIC_OR_DEPARTMENT_OTHER): Payer: Self-pay | Admitting: Orthopedic Surgery

## 2017-07-27 IMAGING — DX DG ANKLE COMPLETE 3+V*R*
3 series · 3 of 3 positions shown · non-contrast
Comparison: None.

CLINICAL DATA: Lateral ankle pain and swelling following twisting
injury.

EXAM:
RIGHT ANKLE - COMPLETE 3+ VIEW

[ankle ap]
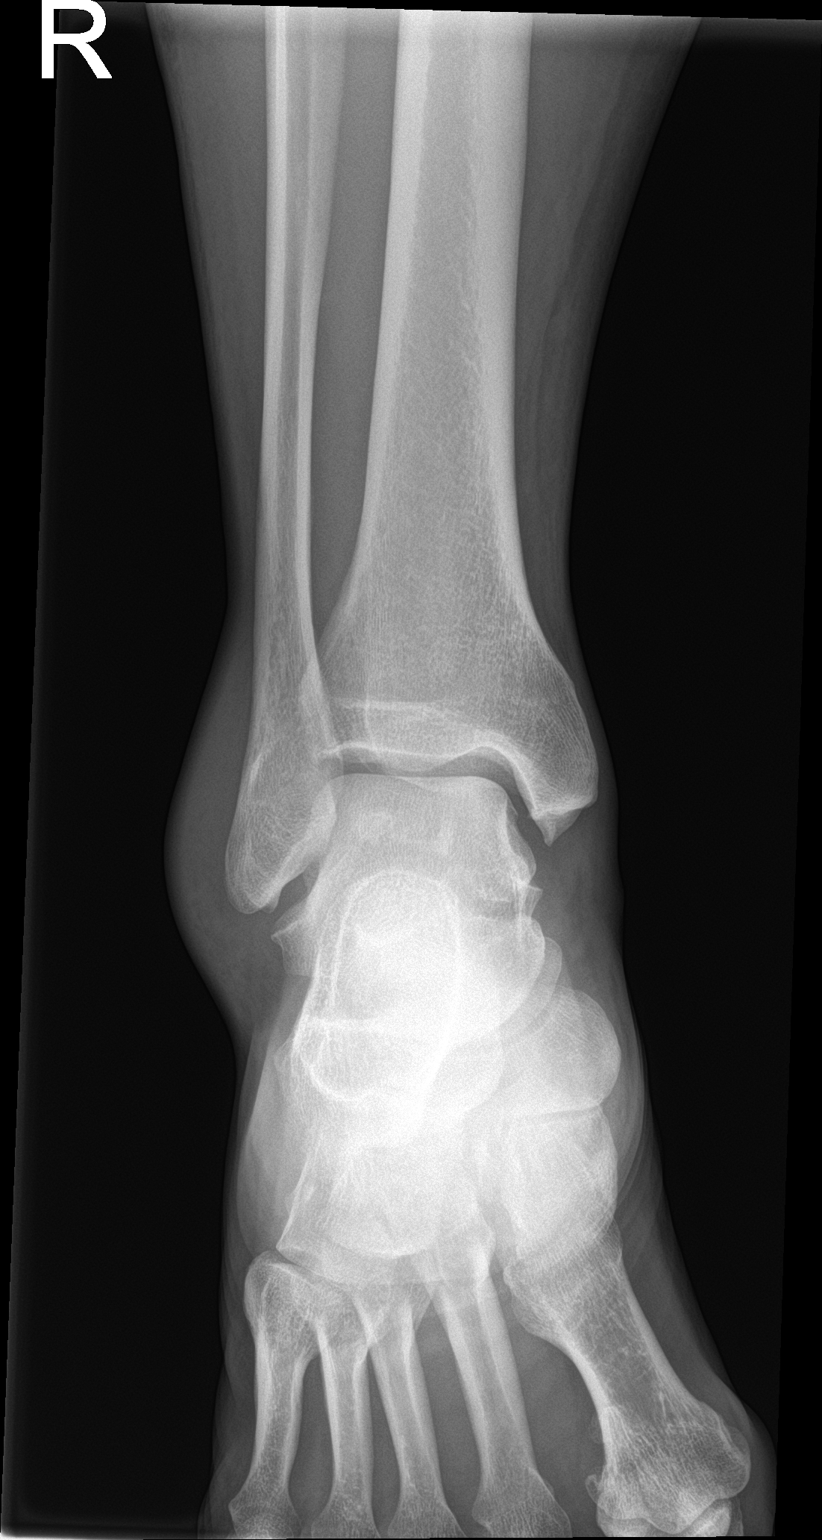

[ankle obl]
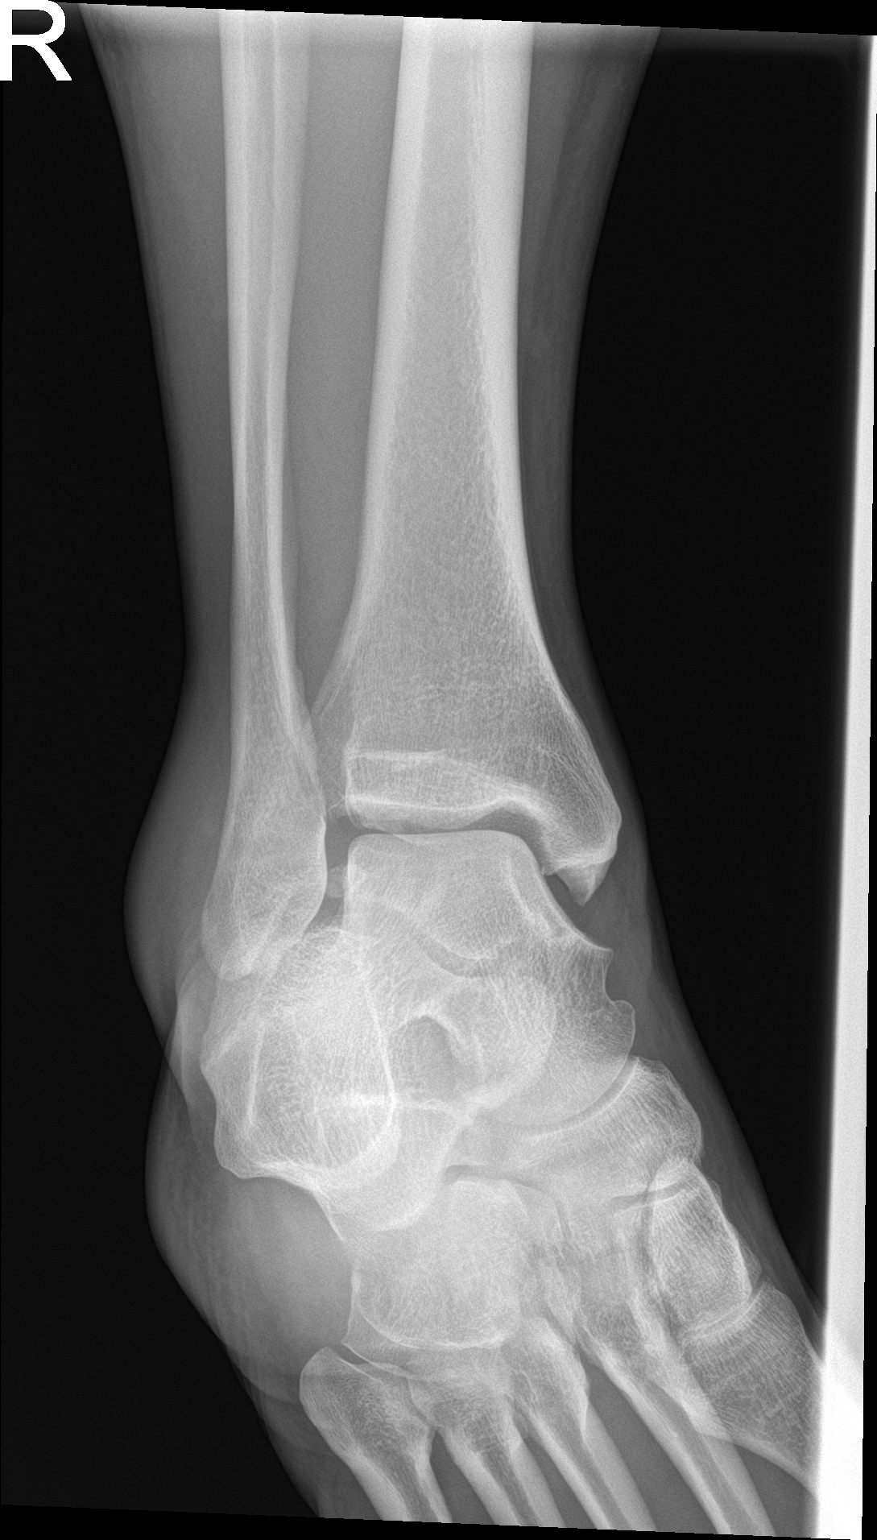

[ankle lat]
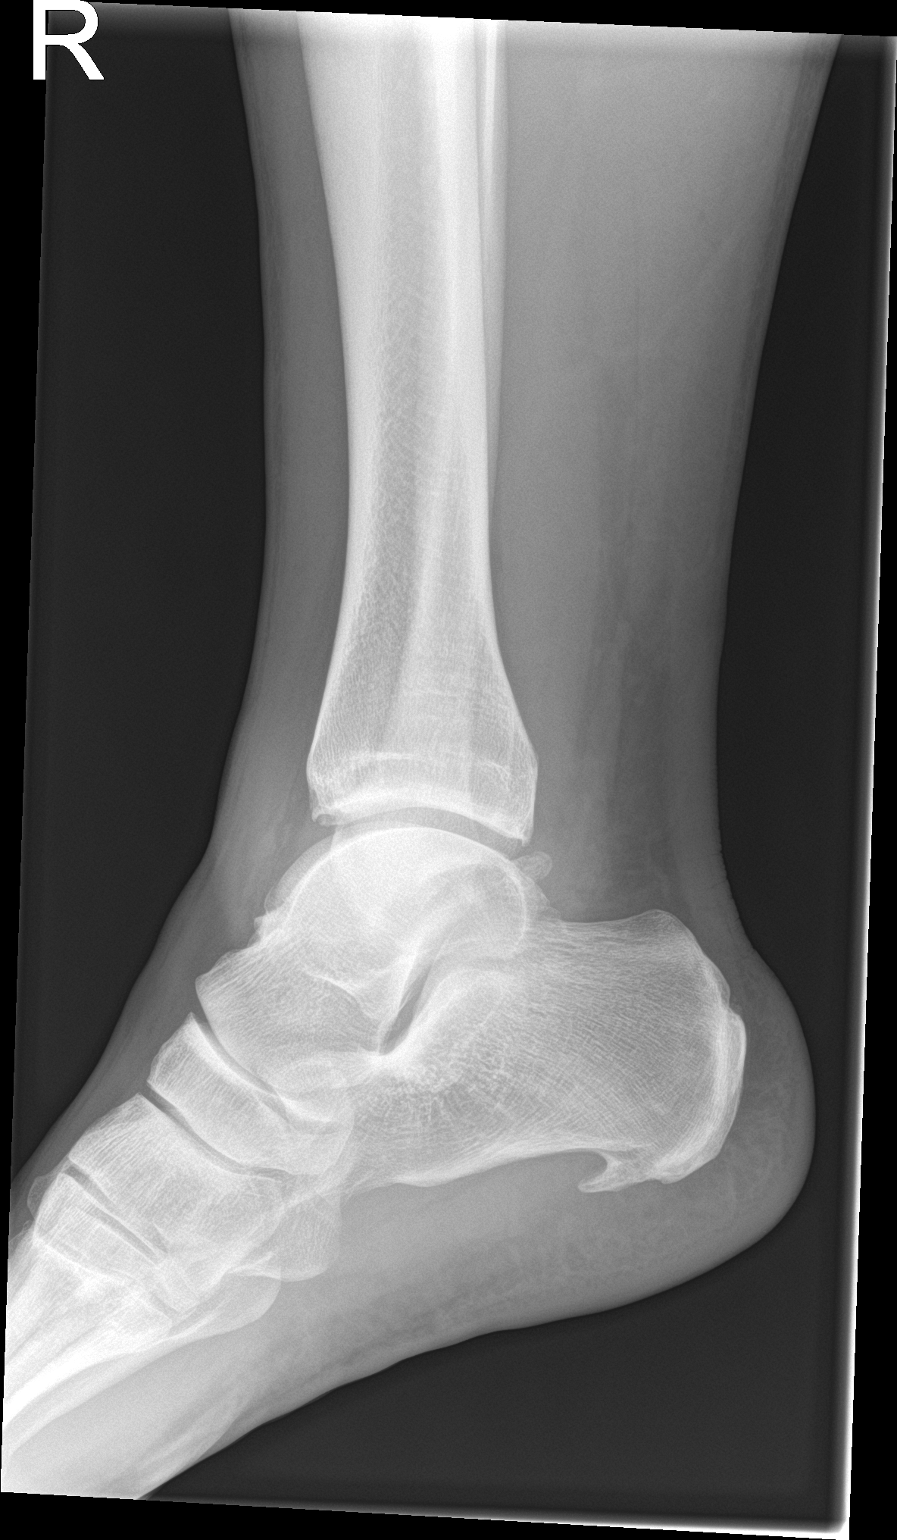

[3 of 3 positions shown; findings below may reference images not displayed]

FINDINGS: The mineralization and alignment are normal. There is no evidence of
acute fracture or dislocation. The joint spaces are maintained.
There is moderate lateral soft tissue swelling. There is a plantar
calcaneal spur.
IMPRESSION: Moderate lateral soft tissue swelling. No acute osseous findings
demonstrated.

## 2023-12-30 ENCOUNTER — Other Ambulatory Visit (HOSPITAL_BASED_OUTPATIENT_CLINIC_OR_DEPARTMENT_OTHER): Payer: Self-pay | Admitting: Internal Medicine

## 2023-12-30 DIAGNOSIS — E7800 Pure hypercholesterolemia, unspecified: Secondary | ICD-10-CM

## 2024-01-13 ENCOUNTER — Ambulatory Visit (HOSPITAL_BASED_OUTPATIENT_CLINIC_OR_DEPARTMENT_OTHER)
Admission: RE | Admit: 2024-01-13 | Discharge: 2024-01-13 | Disposition: A | Discharge status: Home / Self Care | Payer: Self-pay | Source: Ambulatory Visit | Attending: Internal Medicine | Admitting: Internal Medicine

## 2024-01-13 DIAGNOSIS — E7800 Pure hypercholesterolemia, unspecified: Secondary | ICD-10-CM | POA: Insufficient documentation
